# Patient Record
Sex: Female | Born: 1960 | Race: Black or African American | Hispanic: No
Health system: Southern US, Community
[De-identification: ages and names within clinical notes are randomized; demographics above are authoritative.]

---

## 2015-12-12 DIAGNOSIS — I10 Essential (primary) hypertension: Secondary | ICD-10-CM | POA: Diagnosis not present

## 2015-12-12 DIAGNOSIS — I639 Cerebral infarction, unspecified: Secondary | ICD-10-CM | POA: Diagnosis not present

## 2015-12-12 DIAGNOSIS — E782 Mixed hyperlipidemia: Secondary | ICD-10-CM | POA: Diagnosis not present

## 2016-01-28 DIAGNOSIS — R6889 Other general symptoms and signs: Secondary | ICD-10-CM | POA: Diagnosis not present

## 2016-01-28 DIAGNOSIS — I69398 Other sequelae of cerebral infarction: Secondary | ICD-10-CM | POA: Diagnosis not present

## 2016-01-28 DIAGNOSIS — Z13220 Encounter for screening for lipoid disorders: Secondary | ICD-10-CM | POA: Diagnosis not present

## 2016-01-28 DIAGNOSIS — H538 Other visual disturbances: Secondary | ICD-10-CM | POA: Diagnosis not present

## 2016-01-28 DIAGNOSIS — I639 Cerebral infarction, unspecified: Secondary | ICD-10-CM | POA: Diagnosis not present

## 2016-01-28 DIAGNOSIS — I10 Essential (primary) hypertension: Secondary | ICD-10-CM | POA: Diagnosis not present

## 2016-02-23 DIAGNOSIS — G4700 Insomnia, unspecified: Secondary | ICD-10-CM | POA: Diagnosis not present

## 2016-02-23 DIAGNOSIS — I10 Essential (primary) hypertension: Secondary | ICD-10-CM | POA: Diagnosis not present

## 2016-02-23 DIAGNOSIS — R6889 Other general symptoms and signs: Secondary | ICD-10-CM | POA: Diagnosis not present

## 2016-02-23 DIAGNOSIS — E119 Type 2 diabetes mellitus without complications: Secondary | ICD-10-CM | POA: Diagnosis not present

## 2016-04-26 DIAGNOSIS — I10 Essential (primary) hypertension: Secondary | ICD-10-CM | POA: Diagnosis not present

## 2016-04-26 DIAGNOSIS — E119 Type 2 diabetes mellitus without complications: Secondary | ICD-10-CM | POA: Diagnosis not present

## 2016-04-26 DIAGNOSIS — G4700 Insomnia, unspecified: Secondary | ICD-10-CM | POA: Diagnosis not present

## 2016-10-04 ENCOUNTER — Telehealth: Payer: Self-pay | Admitting: Family Medicine

## 2016-10-04 NOTE — Telephone Encounter (Signed)
No Answer/Busy. Couldn't leave a message.

## 2016-12-30 DIAGNOSIS — I10 Essential (primary) hypertension: Secondary | ICD-10-CM | POA: Diagnosis not present

## 2016-12-30 DIAGNOSIS — E119 Type 2 diabetes mellitus without complications: Secondary | ICD-10-CM | POA: Diagnosis not present

## 2016-12-30 DIAGNOSIS — I82409 Acute embolism and thrombosis of unspecified deep veins of unspecified lower extremity: Secondary | ICD-10-CM | POA: Diagnosis not present

## 2016-12-30 DIAGNOSIS — M25561 Pain in right knee: Secondary | ICD-10-CM | POA: Diagnosis not present

## 2017-04-27 DIAGNOSIS — E119 Type 2 diabetes mellitus without complications: Secondary | ICD-10-CM | POA: Diagnosis not present

## 2017-04-27 DIAGNOSIS — F329 Major depressive disorder, single episode, unspecified: Secondary | ICD-10-CM | POA: Diagnosis not present

## 2017-04-27 DIAGNOSIS — I10 Essential (primary) hypertension: Secondary | ICD-10-CM | POA: Diagnosis not present

## 2017-04-27 DIAGNOSIS — G4700 Insomnia, unspecified: Secondary | ICD-10-CM | POA: Diagnosis not present

## 2017-11-08 DIAGNOSIS — Z79899 Other long term (current) drug therapy: Secondary | ICD-10-CM | POA: Diagnosis not present

## 2017-11-08 DIAGNOSIS — R7303 Prediabetes: Secondary | ICD-10-CM | POA: Diagnosis not present

## 2017-11-08 DIAGNOSIS — I10 Essential (primary) hypertension: Secondary | ICD-10-CM | POA: Diagnosis not present

## 2017-11-08 DIAGNOSIS — E119 Type 2 diabetes mellitus without complications: Secondary | ICD-10-CM | POA: Diagnosis not present

## 2017-11-08 DIAGNOSIS — F329 Major depressive disorder, single episode, unspecified: Secondary | ICD-10-CM | POA: Diagnosis not present

## 2017-11-08 DIAGNOSIS — Z0000 Encounter for general adult medical examination without abnormal findings: Secondary | ICD-10-CM | POA: Diagnosis not present

## 2017-11-08 DIAGNOSIS — Z6841 Body Mass Index (BMI) 40.0 and over, adult: Secondary | ICD-10-CM | POA: Diagnosis not present

## 2018-02-22 DIAGNOSIS — E2839 Other primary ovarian failure: Secondary | ICD-10-CM | POA: Diagnosis not present

## 2018-02-22 DIAGNOSIS — I10 Essential (primary) hypertension: Secondary | ICD-10-CM | POA: Diagnosis not present

## 2018-02-22 DIAGNOSIS — Z6841 Body Mass Index (BMI) 40.0 and over, adult: Secondary | ICD-10-CM | POA: Diagnosis not present

## 2018-02-22 DIAGNOSIS — Z0000 Encounter for general adult medical examination without abnormal findings: Secondary | ICD-10-CM | POA: Diagnosis not present

## 2018-02-22 DIAGNOSIS — E7800 Pure hypercholesterolemia, unspecified: Secondary | ICD-10-CM | POA: Diagnosis not present

## 2018-02-22 DIAGNOSIS — E118 Type 2 diabetes mellitus with unspecified complications: Secondary | ICD-10-CM | POA: Diagnosis not present

## 2018-02-22 DIAGNOSIS — Z23 Encounter for immunization: Secondary | ICD-10-CM | POA: Diagnosis not present

## 2018-02-22 DIAGNOSIS — Z1211 Encounter for screening for malignant neoplasm of colon: Secondary | ICD-10-CM | POA: Diagnosis not present

## 2018-04-03 ENCOUNTER — Other Ambulatory Visit: Payer: Self-pay | Admitting: Family Medicine

## 2018-04-03 ENCOUNTER — Other Ambulatory Visit (HOSPITAL_COMMUNITY)
Admission: RE | Admit: 2018-04-03 | Discharge: 2018-04-03 | Disposition: A | Discharge status: Home / Self Care | Payer: Medicare HMO | Source: Ambulatory Visit | Attending: Family Medicine | Admitting: Family Medicine

## 2018-04-03 DIAGNOSIS — Z7984 Long term (current) use of oral hypoglycemic drugs: Secondary | ICD-10-CM | POA: Diagnosis not present

## 2018-04-03 DIAGNOSIS — Z124 Encounter for screening for malignant neoplasm of cervix: Secondary | ICD-10-CM | POA: Insufficient documentation

## 2018-04-03 DIAGNOSIS — Z6839 Body mass index (BMI) 39.0-39.9, adult: Secondary | ICD-10-CM | POA: Diagnosis not present

## 2018-04-03 DIAGNOSIS — Z720 Tobacco use: Secondary | ICD-10-CM | POA: Diagnosis not present

## 2018-04-03 DIAGNOSIS — E7800 Pure hypercholesterolemia, unspecified: Secondary | ICD-10-CM | POA: Diagnosis not present

## 2018-04-03 DIAGNOSIS — E118 Type 2 diabetes mellitus with unspecified complications: Secondary | ICD-10-CM | POA: Diagnosis not present

## 2018-04-03 DIAGNOSIS — I10 Essential (primary) hypertension: Secondary | ICD-10-CM | POA: Diagnosis not present

## 2018-04-05 LAB — CYTOLOGY - PAP
Diagnosis: NEGATIVE
HPV: NOT DETECTED

## 2018-04-13 DIAGNOSIS — M8588 Other specified disorders of bone density and structure, other site: Secondary | ICD-10-CM | POA: Diagnosis not present

## 2018-04-13 DIAGNOSIS — E2839 Other primary ovarian failure: Secondary | ICD-10-CM | POA: Diagnosis not present

## 2018-04-15 DIAGNOSIS — M79602 Pain in left arm: Secondary | ICD-10-CM | POA: Diagnosis not present

## 2018-04-19 DIAGNOSIS — R6889 Other general symptoms and signs: Secondary | ICD-10-CM | POA: Diagnosis not present

## 2018-05-22 DIAGNOSIS — R6889 Other general symptoms and signs: Secondary | ICD-10-CM | POA: Diagnosis not present

## 2018-05-22 DIAGNOSIS — K644 Residual hemorrhoidal skin tags: Secondary | ICD-10-CM | POA: Diagnosis not present

## 2018-05-22 DIAGNOSIS — F329 Major depressive disorder, single episode, unspecified: Secondary | ICD-10-CM | POA: Diagnosis not present

## 2018-06-27 DIAGNOSIS — M79604 Pain in right leg: Secondary | ICD-10-CM | POA: Diagnosis not present

## 2018-06-27 DIAGNOSIS — F321 Major depressive disorder, single episode, moderate: Secondary | ICD-10-CM | POA: Diagnosis not present

## 2018-06-27 DIAGNOSIS — E1169 Type 2 diabetes mellitus with other specified complication: Secondary | ICD-10-CM | POA: Diagnosis not present

## 2018-06-27 DIAGNOSIS — E7800 Pure hypercholesterolemia, unspecified: Secondary | ICD-10-CM | POA: Diagnosis not present

## 2018-06-27 DIAGNOSIS — I10 Essential (primary) hypertension: Secondary | ICD-10-CM | POA: Diagnosis not present

## 2018-06-27 DIAGNOSIS — F17210 Nicotine dependence, cigarettes, uncomplicated: Secondary | ICD-10-CM | POA: Diagnosis not present

## 2018-06-29 ENCOUNTER — Other Ambulatory Visit: Payer: Self-pay | Admitting: Family Medicine

## 2018-06-29 DIAGNOSIS — M79604 Pain in right leg: Secondary | ICD-10-CM

## 2018-06-29 DIAGNOSIS — Z86718 Personal history of other venous thrombosis and embolism: Secondary | ICD-10-CM

## 2018-07-05 ENCOUNTER — Other Ambulatory Visit: Payer: Medicare HMO

## 2018-07-12 ENCOUNTER — Ambulatory Visit
Admission: RE | Admit: 2018-07-12 | Discharge: 2018-07-12 | Disposition: A | Discharge status: Home / Self Care | Payer: Medicare HMO | Source: Ambulatory Visit | Attending: Family Medicine | Admitting: Family Medicine

## 2018-07-12 DIAGNOSIS — M79604 Pain in right leg: Secondary | ICD-10-CM

## 2018-07-12 DIAGNOSIS — Z86718 Personal history of other venous thrombosis and embolism: Secondary | ICD-10-CM

## 2018-07-12 DIAGNOSIS — R600 Localized edema: Secondary | ICD-10-CM | POA: Diagnosis not present

## 2018-09-27 DIAGNOSIS — F324 Major depressive disorder, single episode, in partial remission: Secondary | ICD-10-CM | POA: Diagnosis not present

## 2018-09-27 DIAGNOSIS — E1169 Type 2 diabetes mellitus with other specified complication: Secondary | ICD-10-CM | POA: Diagnosis not present

## 2018-09-27 DIAGNOSIS — I6930 Unspecified sequelae of cerebral infarction: Secondary | ICD-10-CM | POA: Diagnosis not present

## 2018-09-27 DIAGNOSIS — F17210 Nicotine dependence, cigarettes, uncomplicated: Secondary | ICD-10-CM | POA: Diagnosis not present

## 2018-09-27 DIAGNOSIS — I10 Essential (primary) hypertension: Secondary | ICD-10-CM | POA: Diagnosis not present

## 2018-09-27 DIAGNOSIS — Z1211 Encounter for screening for malignant neoplasm of colon: Secondary | ICD-10-CM | POA: Diagnosis not present

## 2018-09-27 DIAGNOSIS — H53453 Other localized visual field defect, bilateral: Secondary | ICD-10-CM | POA: Diagnosis not present

## 2018-09-27 DIAGNOSIS — E7800 Pure hypercholesterolemia, unspecified: Secondary | ICD-10-CM | POA: Diagnosis not present

## 2018-11-09 ENCOUNTER — Ambulatory Visit: Payer: Self-pay | Admitting: Nurse Practitioner

## 2018-11-09 ENCOUNTER — Ambulatory Visit: Payer: Self-pay

## 2018-11-10 ENCOUNTER — Ambulatory Visit: Payer: Self-pay | Admitting: Nurse Practitioner

## 2019-03-09 DIAGNOSIS — M25561 Pain in right knee: Secondary | ICD-10-CM | POA: Diagnosis not present

## 2019-03-09 DIAGNOSIS — H53453 Other localized visual field defect, bilateral: Secondary | ICD-10-CM | POA: Diagnosis not present

## 2019-03-09 DIAGNOSIS — F17210 Nicotine dependence, cigarettes, uncomplicated: Secondary | ICD-10-CM | POA: Diagnosis not present

## 2019-03-09 DIAGNOSIS — E1169 Type 2 diabetes mellitus with other specified complication: Secondary | ICD-10-CM | POA: Diagnosis not present

## 2019-03-09 DIAGNOSIS — R29818 Other symptoms and signs involving the nervous system: Secondary | ICD-10-CM | POA: Diagnosis not present

## 2019-03-28 DIAGNOSIS — M1711 Unilateral primary osteoarthritis, right knee: Secondary | ICD-10-CM | POA: Diagnosis not present

## 2019-04-25 DIAGNOSIS — F324 Major depressive disorder, single episode, in partial remission: Secondary | ICD-10-CM | POA: Diagnosis not present

## 2019-04-25 DIAGNOSIS — H53453 Other localized visual field defect, bilateral: Secondary | ICD-10-CM | POA: Diagnosis not present

## 2019-04-25 DIAGNOSIS — I10 Essential (primary) hypertension: Secondary | ICD-10-CM | POA: Diagnosis not present

## 2019-04-25 DIAGNOSIS — N3281 Overactive bladder: Secondary | ICD-10-CM | POA: Diagnosis not present

## 2019-04-25 DIAGNOSIS — E7800 Pure hypercholesterolemia, unspecified: Secondary | ICD-10-CM | POA: Diagnosis not present

## 2019-04-25 DIAGNOSIS — Z0000 Encounter for general adult medical examination without abnormal findings: Secondary | ICD-10-CM | POA: Diagnosis not present

## 2019-04-25 DIAGNOSIS — I6930 Unspecified sequelae of cerebral infarction: Secondary | ICD-10-CM | POA: Diagnosis not present

## 2019-04-25 DIAGNOSIS — M8588 Other specified disorders of bone density and structure, other site: Secondary | ICD-10-CM | POA: Diagnosis not present

## 2019-04-25 DIAGNOSIS — E1169 Type 2 diabetes mellitus with other specified complication: Secondary | ICD-10-CM | POA: Diagnosis not present

## 2019-04-26 DIAGNOSIS — E1169 Type 2 diabetes mellitus with other specified complication: Secondary | ICD-10-CM | POA: Diagnosis not present

## 2019-04-26 DIAGNOSIS — Z7984 Long term (current) use of oral hypoglycemic drugs: Secondary | ICD-10-CM | POA: Diagnosis not present

## 2019-04-26 DIAGNOSIS — I10 Essential (primary) hypertension: Secondary | ICD-10-CM | POA: Diagnosis not present

## 2019-04-26 DIAGNOSIS — E7800 Pure hypercholesterolemia, unspecified: Secondary | ICD-10-CM | POA: Diagnosis not present

## 2019-04-26 DIAGNOSIS — Z1211 Encounter for screening for malignant neoplasm of colon: Secondary | ICD-10-CM | POA: Diagnosis not present

## 2019-04-27 DIAGNOSIS — Z1211 Encounter for screening for malignant neoplasm of colon: Secondary | ICD-10-CM | POA: Diagnosis not present

## 2019-07-27 DIAGNOSIS — M1711 Unilateral primary osteoarthritis, right knee: Secondary | ICD-10-CM | POA: Diagnosis not present

## 2019-08-17 DIAGNOSIS — F17210 Nicotine dependence, cigarettes, uncomplicated: Secondary | ICD-10-CM | POA: Diagnosis not present

## 2019-08-17 DIAGNOSIS — H9201 Otalgia, right ear: Secondary | ICD-10-CM | POA: Diagnosis not present

## 2019-10-03 DIAGNOSIS — F17210 Nicotine dependence, cigarettes, uncomplicated: Secondary | ICD-10-CM | POA: Diagnosis not present

## 2019-10-03 DIAGNOSIS — R1031 Right lower quadrant pain: Secondary | ICD-10-CM | POA: Diagnosis not present

## 2019-10-10 ENCOUNTER — Other Ambulatory Visit: Payer: Self-pay | Admitting: Family Medicine

## 2019-10-10 DIAGNOSIS — R1031 Right lower quadrant pain: Secondary | ICD-10-CM

## 2019-10-26 ENCOUNTER — Other Ambulatory Visit: Payer: Medicare HMO

## 2019-11-05 ENCOUNTER — Ambulatory Visit
Admission: RE | Admit: 2019-11-05 | Discharge: 2019-11-05 | Disposition: A | Discharge status: Home / Self Care | Payer: Medicare HMO | Source: Ambulatory Visit | Attending: Family Medicine | Admitting: Family Medicine

## 2019-11-05 DIAGNOSIS — R1031 Right lower quadrant pain: Secondary | ICD-10-CM

## 2019-12-31 DIAGNOSIS — M25562 Pain in left knee: Secondary | ICD-10-CM | POA: Diagnosis not present

## 2019-12-31 DIAGNOSIS — Z6838 Body mass index (BMI) 38.0-38.9, adult: Secondary | ICD-10-CM | POA: Diagnosis not present

## 2019-12-31 DIAGNOSIS — M1711 Unilateral primary osteoarthritis, right knee: Secondary | ICD-10-CM | POA: Diagnosis not present

## 2020-03-14 DIAGNOSIS — I10 Essential (primary) hypertension: Secondary | ICD-10-CM | POA: Diagnosis not present

## 2020-03-14 DIAGNOSIS — E7800 Pure hypercholesterolemia, unspecified: Secondary | ICD-10-CM | POA: Diagnosis not present

## 2020-03-14 DIAGNOSIS — E1169 Type 2 diabetes mellitus with other specified complication: Secondary | ICD-10-CM | POA: Diagnosis not present

## 2020-03-14 DIAGNOSIS — Z7984 Long term (current) use of oral hypoglycemic drugs: Secondary | ICD-10-CM | POA: Diagnosis not present

## 2020-03-14 DIAGNOSIS — F324 Major depressive disorder, single episode, in partial remission: Secondary | ICD-10-CM | POA: Diagnosis not present

## 2020-03-19 DIAGNOSIS — E1169 Type 2 diabetes mellitus with other specified complication: Secondary | ICD-10-CM | POA: Diagnosis not present

## 2020-03-19 DIAGNOSIS — E7800 Pure hypercholesterolemia, unspecified: Secondary | ICD-10-CM | POA: Diagnosis not present

## 2020-03-19 DIAGNOSIS — I10 Essential (primary) hypertension: Secondary | ICD-10-CM | POA: Diagnosis not present

## 2020-03-19 DIAGNOSIS — Z7984 Long term (current) use of oral hypoglycemic drugs: Secondary | ICD-10-CM | POA: Diagnosis not present

## 2020-06-05 ENCOUNTER — Telehealth: Payer: Self-pay

## 2020-06-05 NOTE — Telephone Encounter (Signed)
REFERRAL ONLY FROM [facility] [phone], SENT REFERRAL TO SCHEDULING

## 2020-08-01 ENCOUNTER — Ambulatory Visit: Payer: Self-pay | Admitting: Cardiovascular Disease

## 2020-10-14 IMAGING — US US PELVIS COMPLETE WITH TRANSVAGINAL
1 series · 13 of 25 positions shown · non-contrast
Comparison: None

CLINICAL DATA: Intermittent RIGHT lower quadrant pain and cramping;
postmenopausal

EXAM:
TRANSABDOMINAL AND TRANSVAGINAL ULTRASOUND OF PELVIS
TECHNIQUE: Both transabdominal and transvaginal ultrasound examinations of the
pelvis were performed. Transabdominal technique was performed for
global imaging of the pelvis including uterus, ovaries, adnexal
regions, and pelvic cul-de-sac. It was necessary to proceed with
endovaginal exam following the transabdominal exam to visualize the
endometrium and ovaries.

[Series 1: us pelvis complete with transvaginal · 0.23mm/px · 13 of 49 slices shown]
[im 1/49]
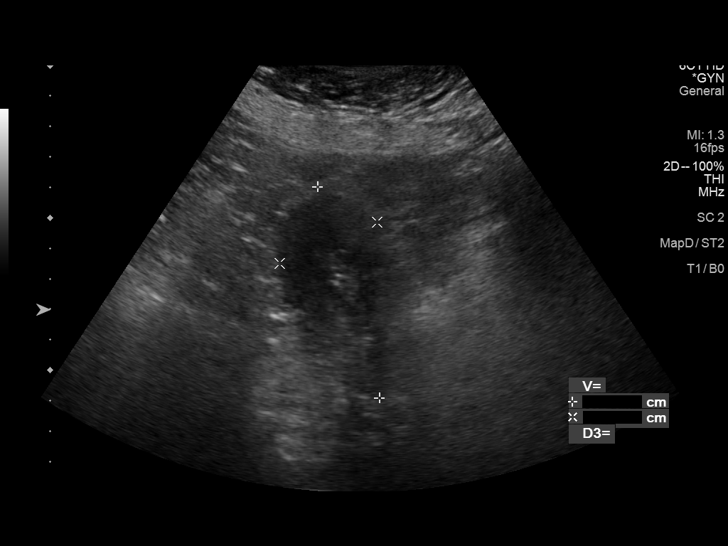
[im 5/49]
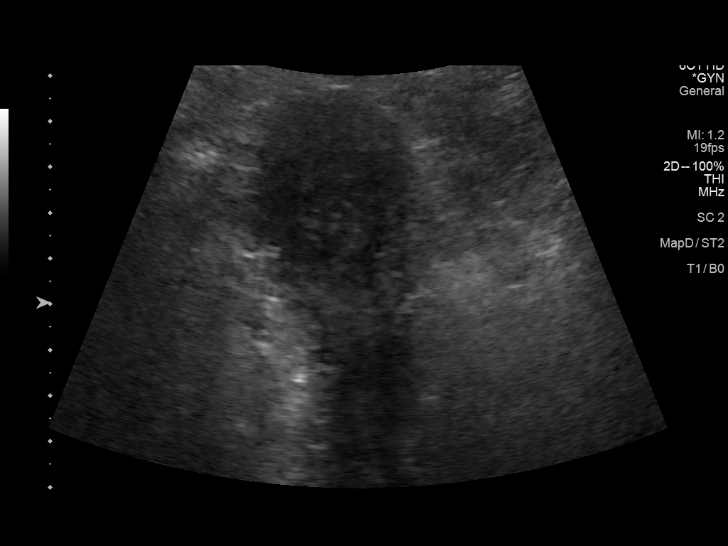
[im 9/49]
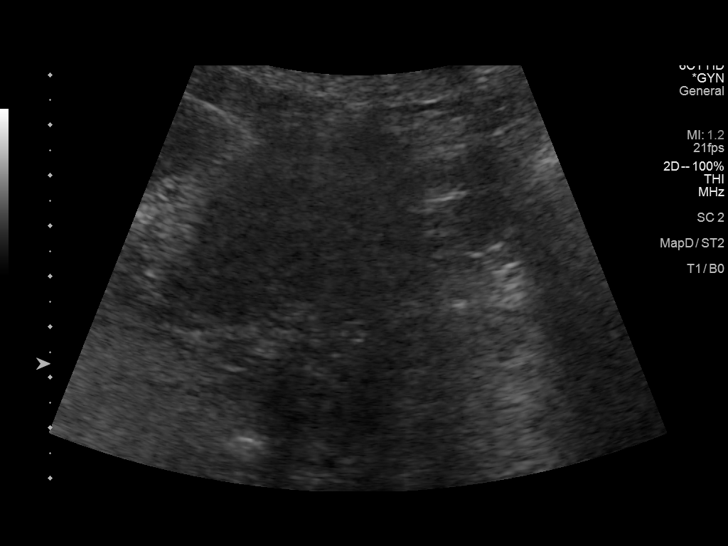
[im 13/49]
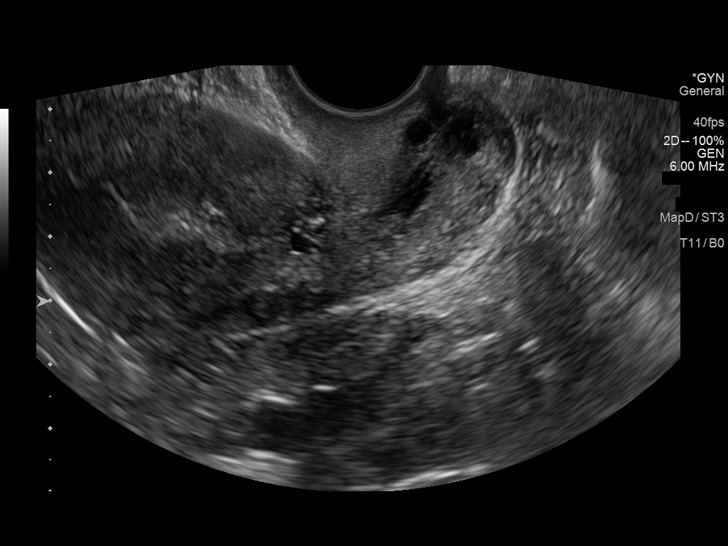
[im 17/49]
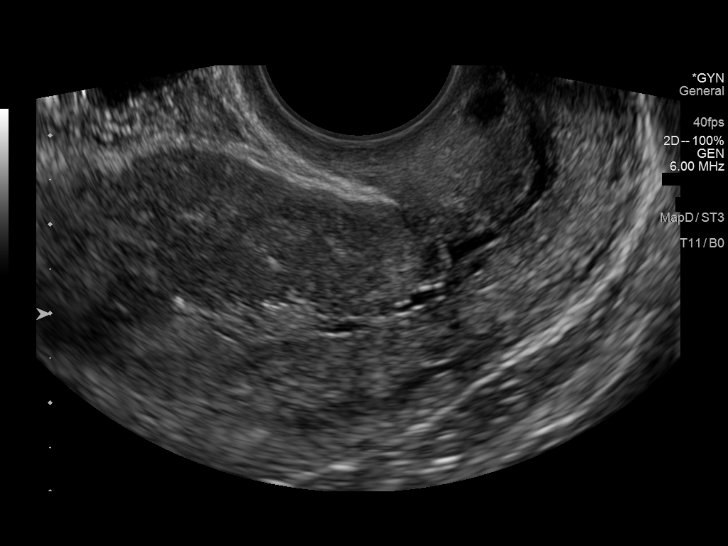
[im 21/49]
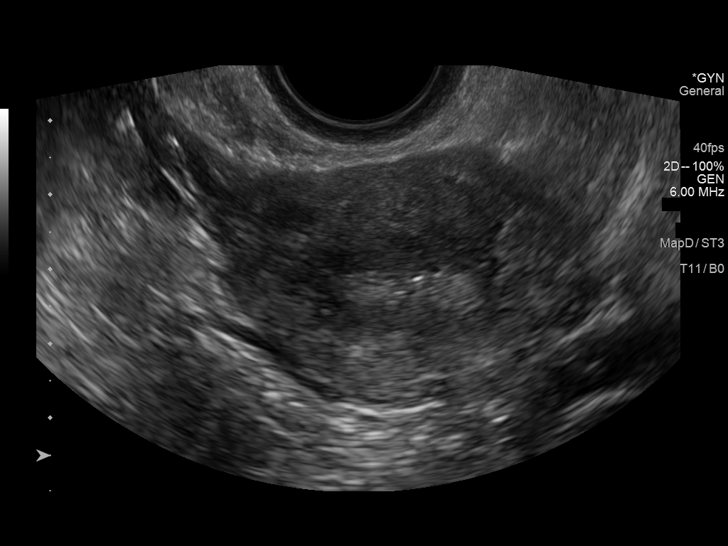
[im 25/49]
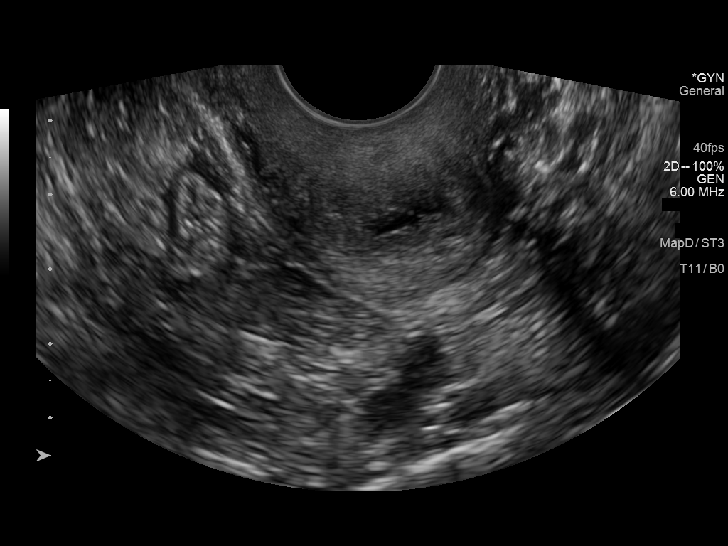
[im 29/49]
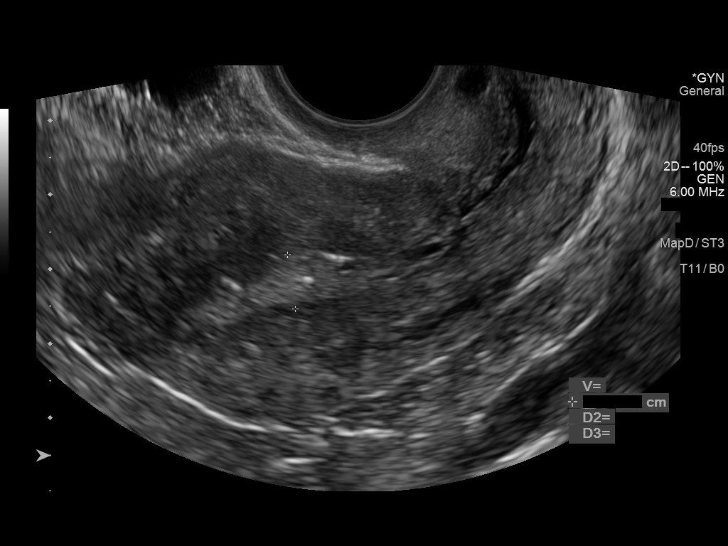
[im 33/49]
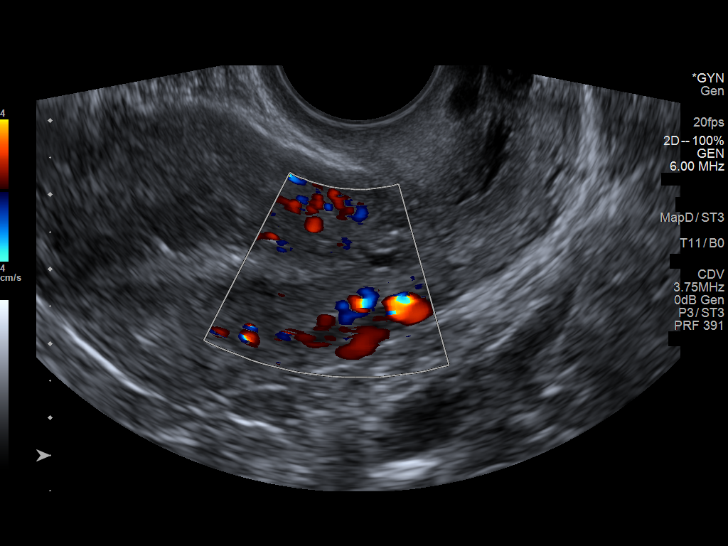
[im 37/49]
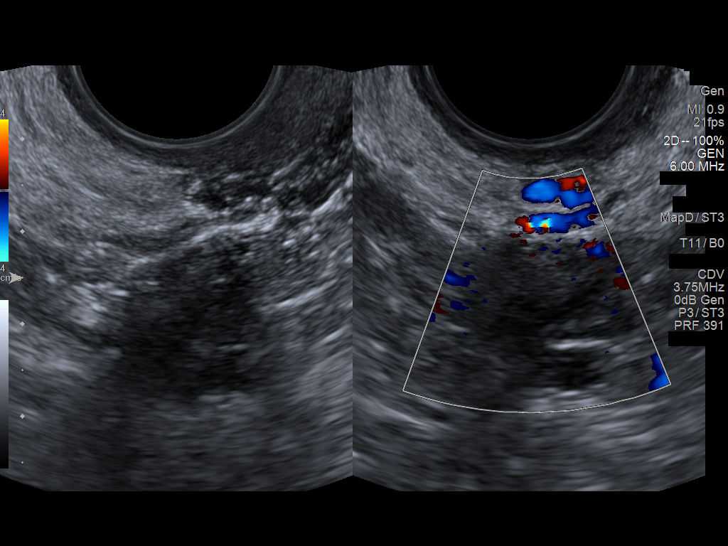
[im 41/49]
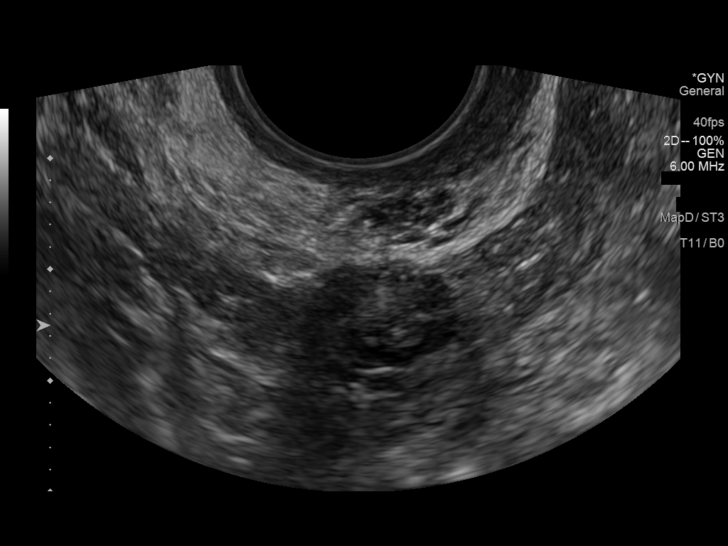
[im 45/49]
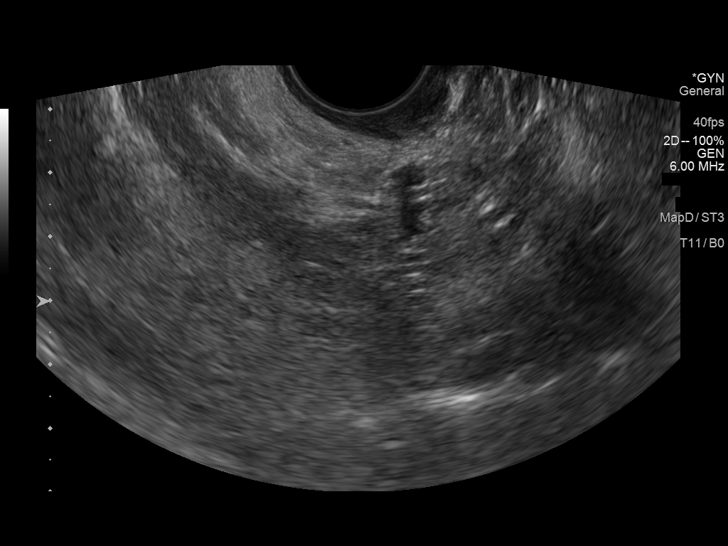
[im 49/49]
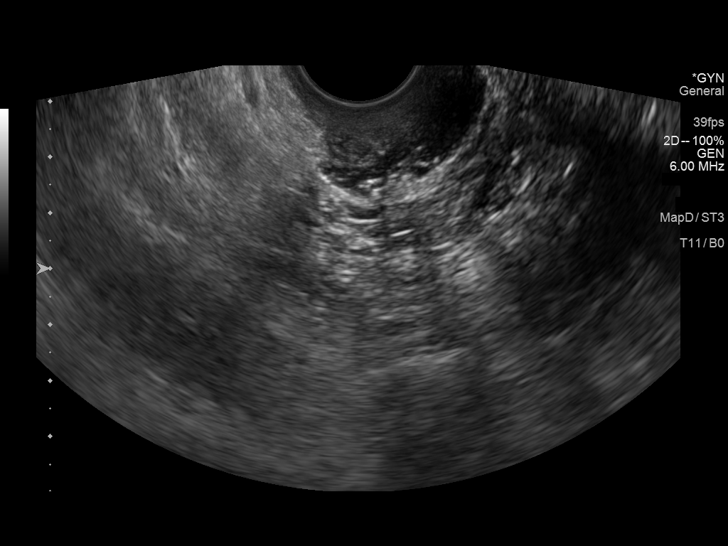

[13 of 25 positions shown; findings below may reference images not displayed]

FINDINGS: Uterus

Measurements: 7.8 x 3.8 x 5.1 cm = volume: 79 mL. Anteverted.
Heterogeneous myometrium. Nabothian cysts in cervix. No definite
uterine mass

Endometrium

Thickness: 7 mm. Minimally heterogeneous appearance. Small amount of
fluid within endocervical canal. Several small echogenic foci
question calcifications within endometrium. No focal endometrial
mass.

Right ovary

Measurements: 1.5 x 1.9 x 1.0 cm = volume: 1.5 mL. Normal morphology
without mass.

Left ovary

Not visualized on either transabdominal or endovaginal imaging,
likely obscured by bowel

Other findings

No free pelvic fluid.  No adnexal masses.
IMPRESSION: Nonvisualization of LEFT ovary.

Endometrial complex measures 7 mm thick and contains several
calcifications and minimal endocervical canal fluid cough,
nonspecific.

No other focal abnormalities.

## 2021-08-13 ENCOUNTER — Other Ambulatory Visit: Payer: Self-pay

## 2021-08-13 ENCOUNTER — Other Ambulatory Visit: Payer: Self-pay | Admitting: Family Medicine

## 2021-08-13 ENCOUNTER — Ambulatory Visit
Admission: RE | Admit: 2021-08-13 | Discharge: 2021-08-13 | Disposition: A | Discharge status: Home / Self Care | Payer: Medicare Other | Source: Ambulatory Visit | Attending: Family Medicine | Admitting: Family Medicine

## 2021-08-13 DIAGNOSIS — R062 Wheezing: Secondary | ICD-10-CM

## 2022-03-24 ENCOUNTER — Ambulatory Visit (HOSPITAL_BASED_OUTPATIENT_CLINIC_OR_DEPARTMENT_OTHER): Payer: Medicare Other | Admitting: Orthopaedic Surgery

## 2022-04-01 ENCOUNTER — Ambulatory Visit
Admission: RE | Admit: 2022-04-01 | Discharge: 2022-04-01 | Disposition: A | Discharge status: Home / Self Care | Payer: Medicare Other | Source: Ambulatory Visit | Attending: Family Medicine | Admitting: Family Medicine

## 2022-04-01 ENCOUNTER — Other Ambulatory Visit: Payer: Self-pay | Admitting: Family Medicine

## 2022-04-01 DIAGNOSIS — J209 Acute bronchitis, unspecified: Secondary | ICD-10-CM

## 2022-04-28 ENCOUNTER — Ambulatory Visit (HOSPITAL_BASED_OUTPATIENT_CLINIC_OR_DEPARTMENT_OTHER): Payer: Medicare Other | Admitting: Orthopaedic Surgery

## 2022-07-23 IMAGING — CR DG CHEST 2V
2 series · 2 of 2 positions shown · non-contrast
Comparison: None

CLINICAL DATA: Wheezing for 2.5 months since quit smoking,
hypertension, diabetes mellitus

EXAM:
CHEST - 2 VIEW

[w chest pa]
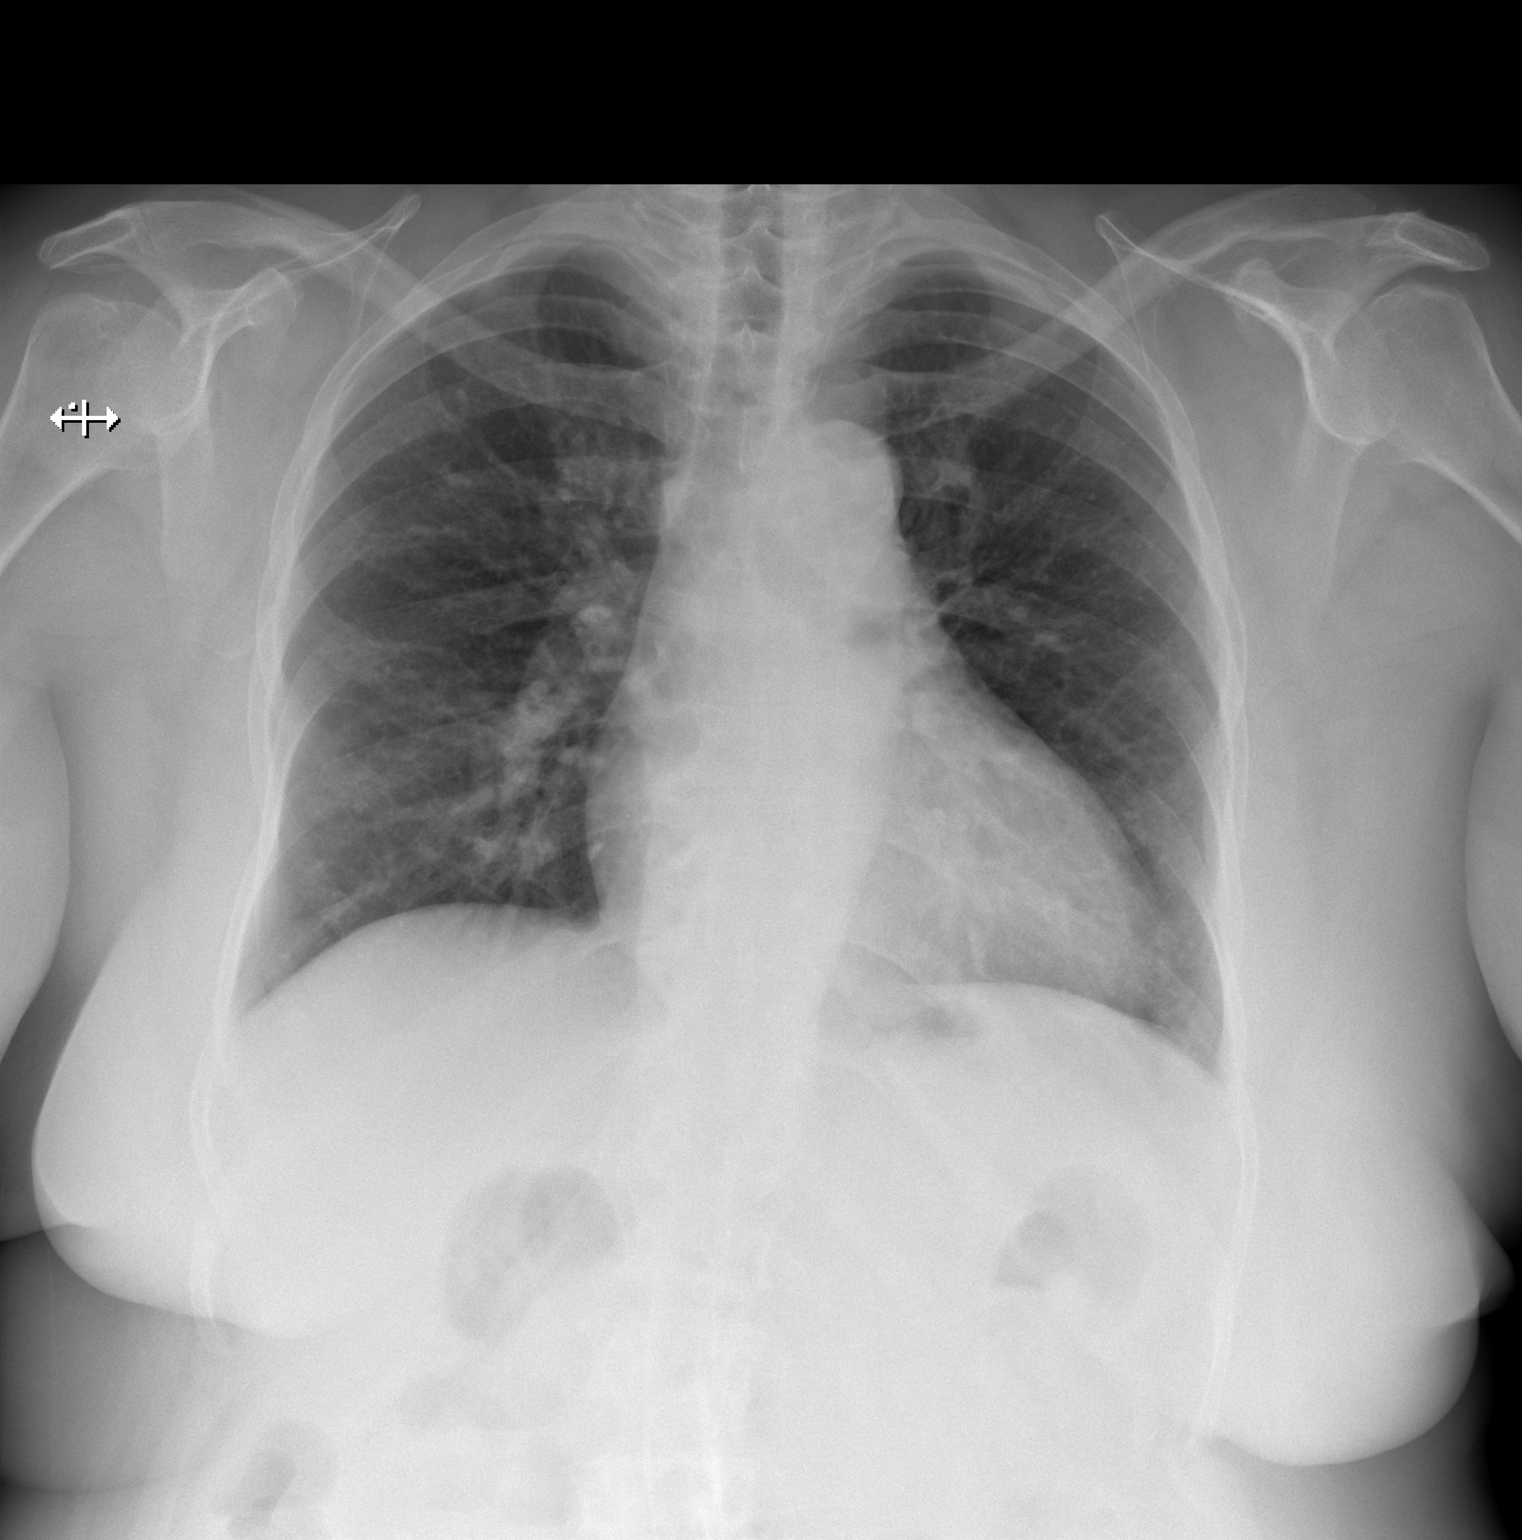

[w chest lat]
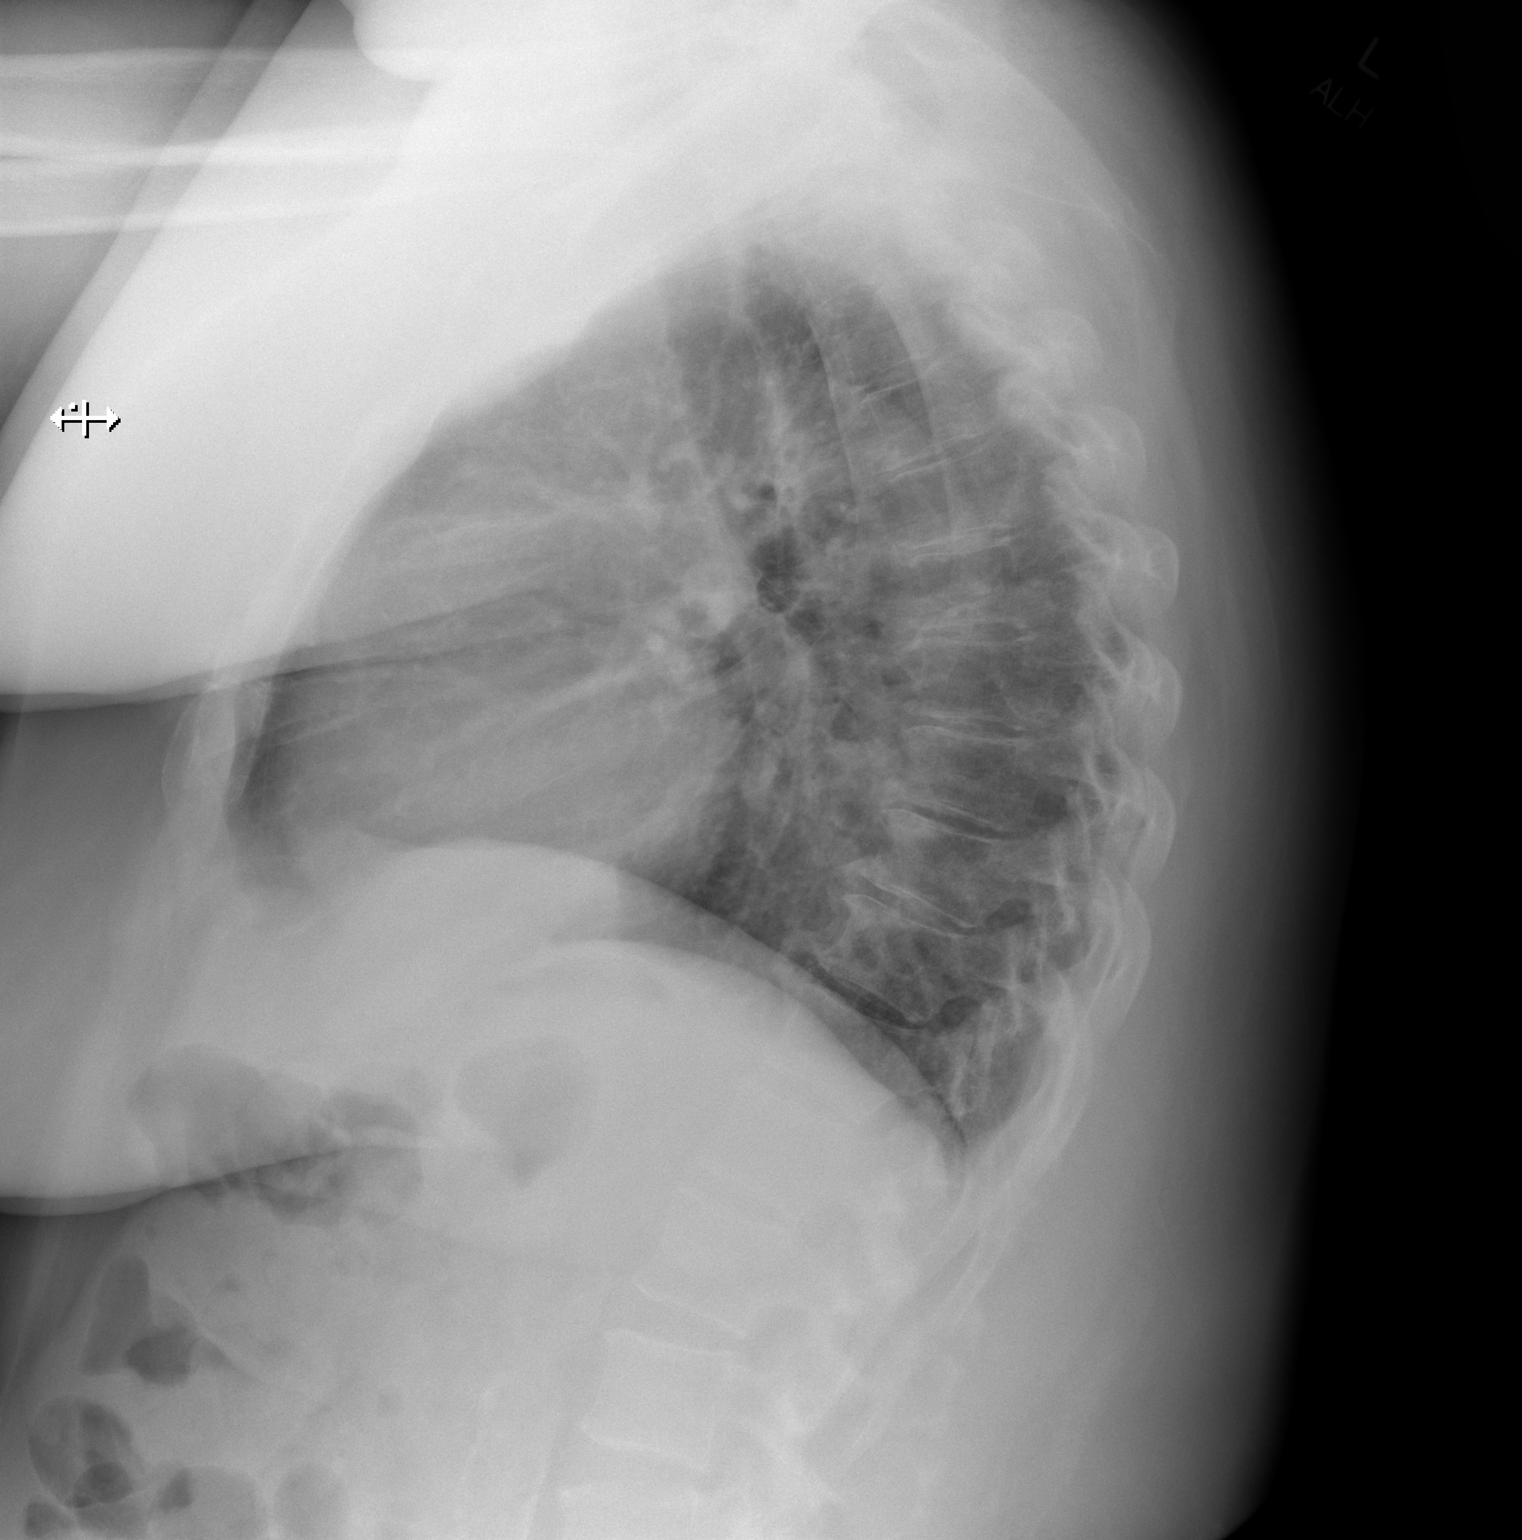

[2 of 2 positions shown; findings below may reference images not displayed]

FINDINGS: Enlargement of cardiac silhouette.

Mediastinal contours and pulmonary vascularity normal.

Lungs clear.

No pulmonary infiltrate, pleural effusion, or pneumothorax.

Osseous structures unremarkable.
IMPRESSION: No acute abnormalities.
